# Patient Record
Sex: Male | Born: 1965 | Race: White | Hispanic: No | Marital: Married | Smoking: Never smoker
Health system: Southern US, Community
[De-identification: ages and names within clinical notes are randomized; demographics above are authoritative.]

---

## 1999-07-07 ENCOUNTER — Ambulatory Visit (HOSPITAL_BASED_OUTPATIENT_CLINIC_OR_DEPARTMENT_OTHER): Admission: RE | Admit: 1999-07-07 | Discharge: 1999-07-07 | Payer: Self-pay | Admitting: General Surgery

## 2008-02-20 ENCOUNTER — Ambulatory Visit: Payer: Self-pay | Admitting: Internal Medicine

## 2009-12-26 IMAGING — CR DG LUMBAR SPINE AP/LAT/OBLIQUES W/ FLEX AND EXT
1 series · 5 of 5 positions shown · non-contrast
Comparison: none

REASON FOR EXAM: Pain, radiculitis, lower back spasms
COMMENTS:

[Series 1: view not recorded · 0.17mm/px · 5 of 5 slices shown]
[im 1/5]
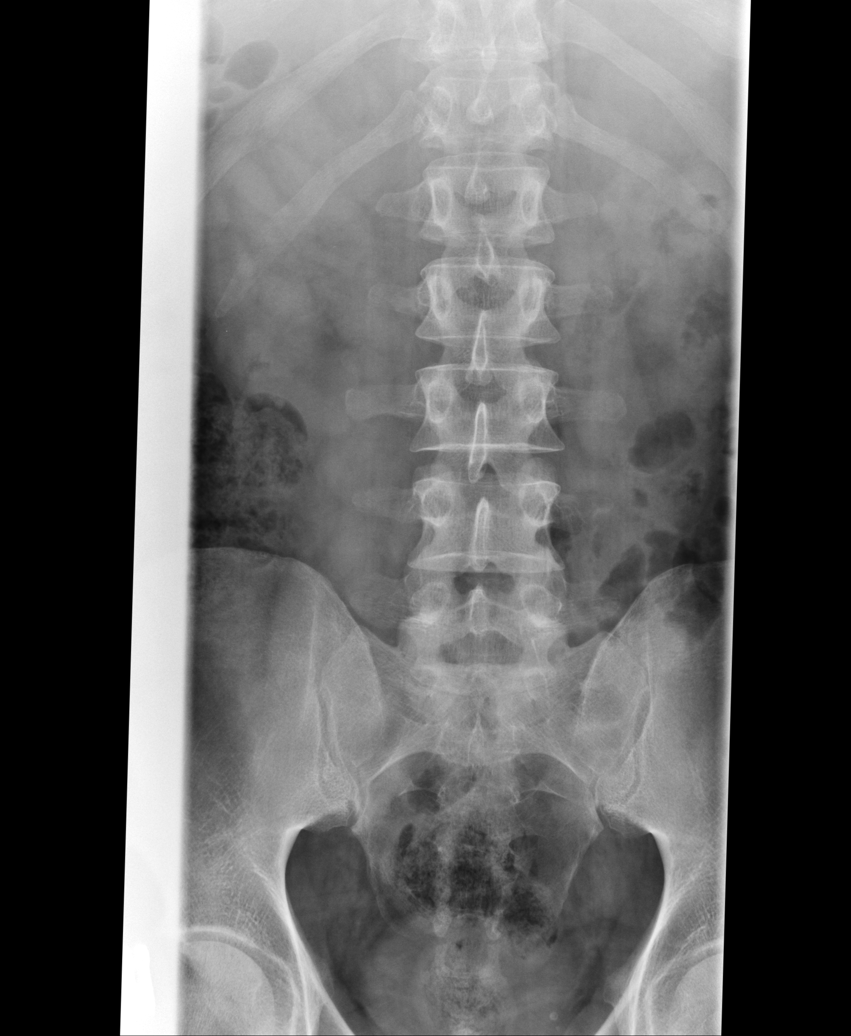
[im 2/5]
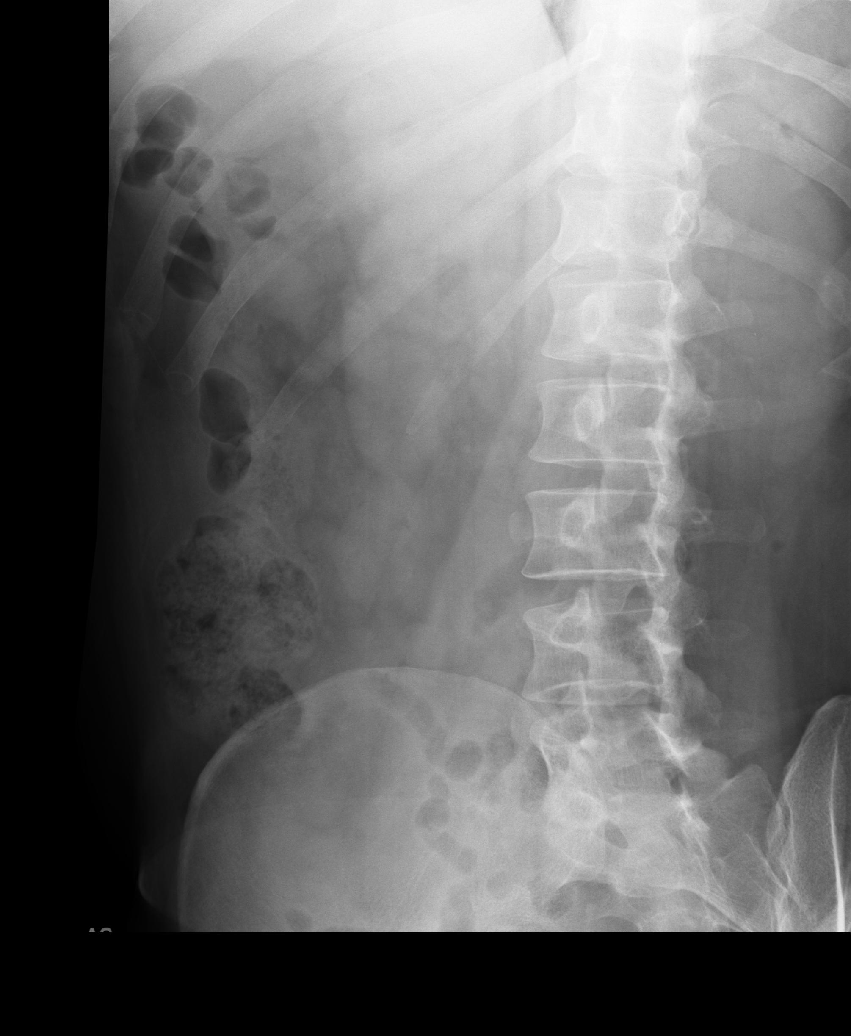
[im 3/5]
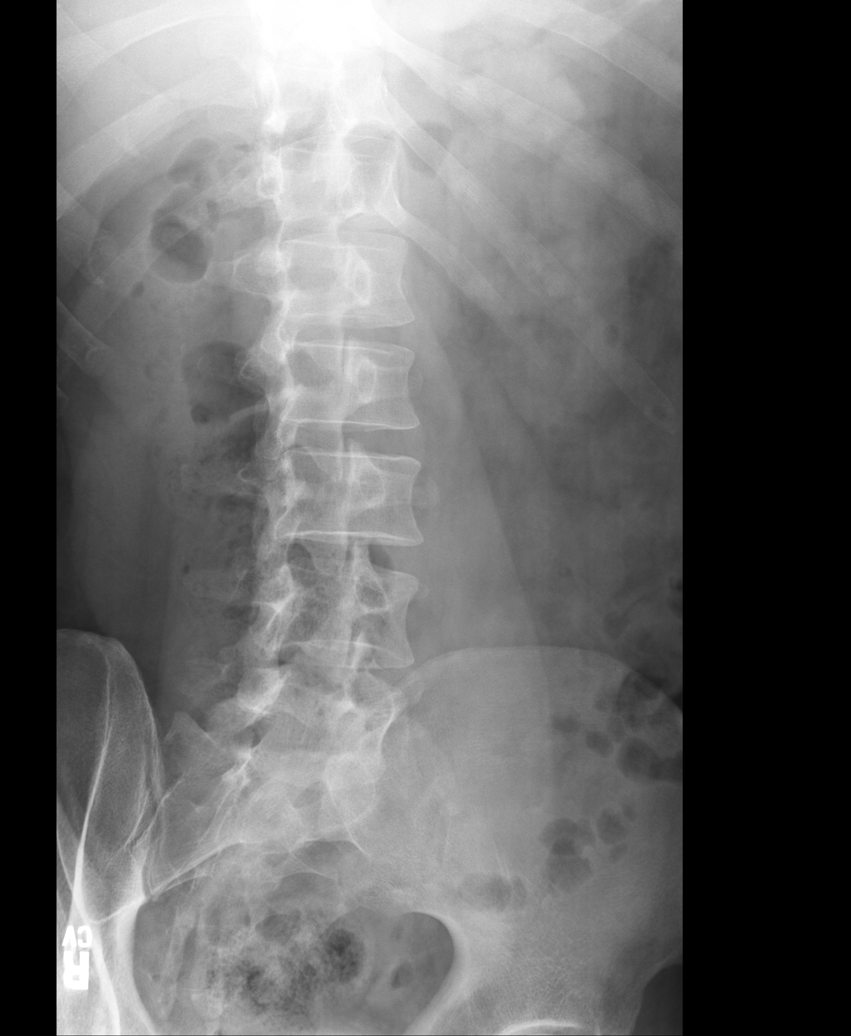
[im 4/5]
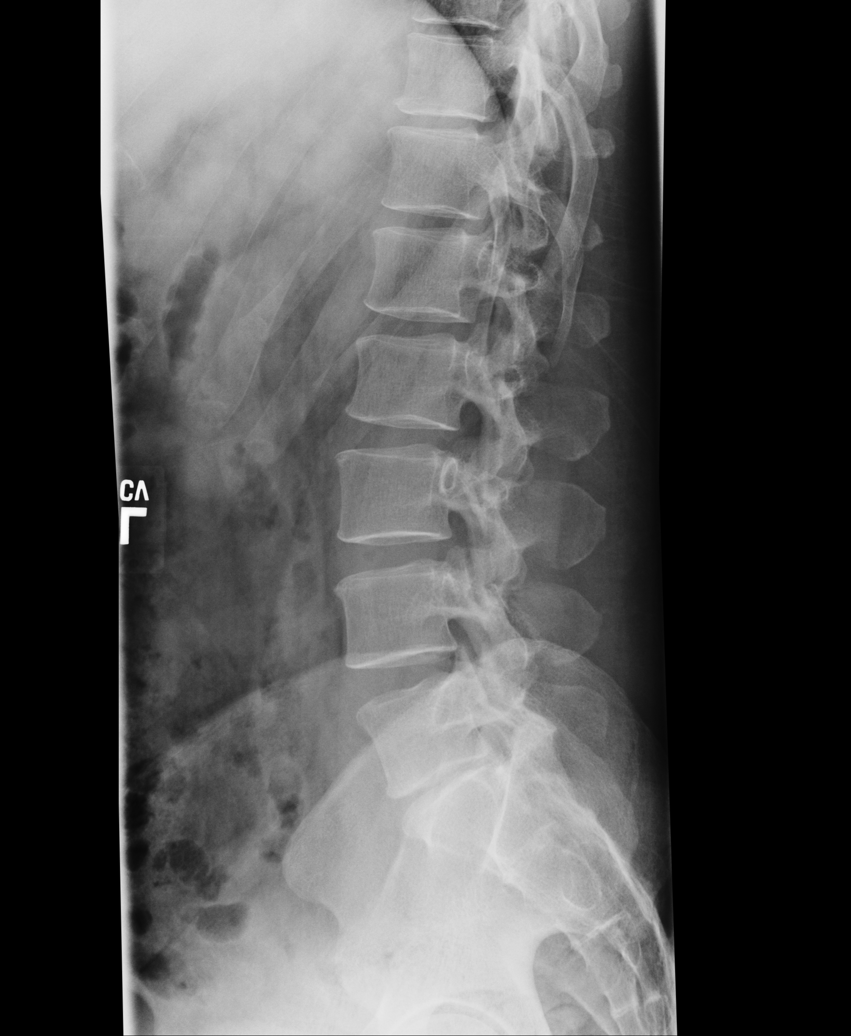
[im 5/5]
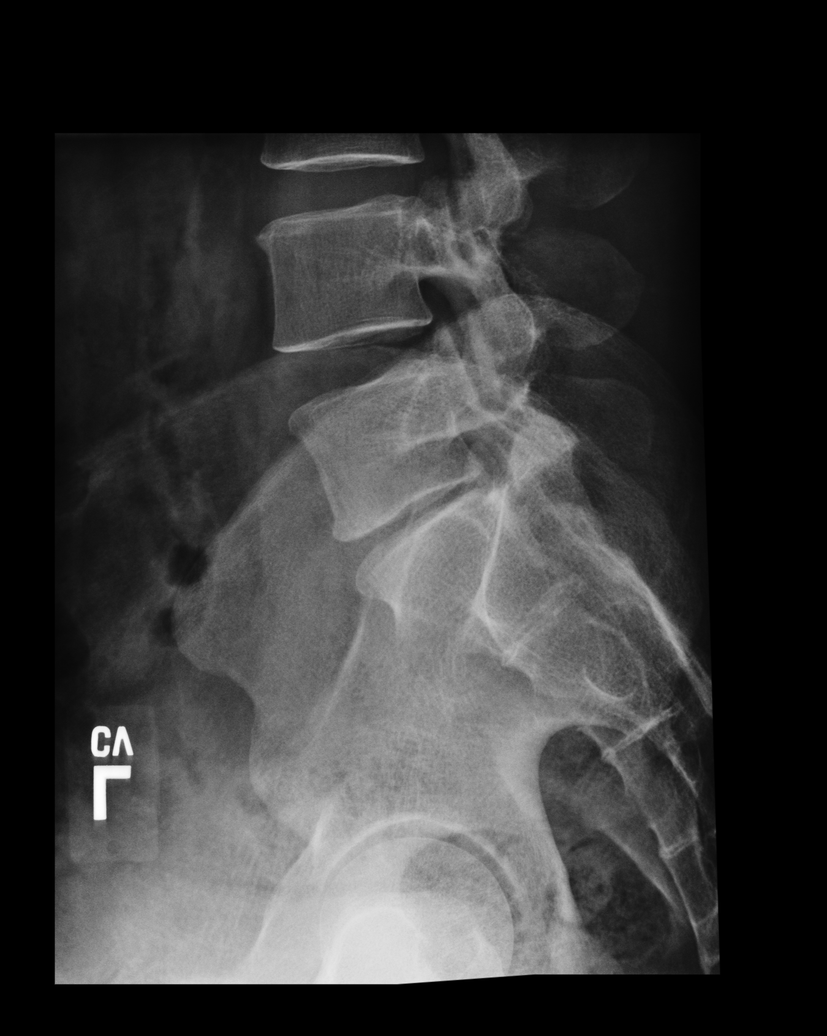

[5 of 5 positions shown; findings below may reference images not displayed]

PROCEDURE:     DXR - DXR LUMBAR SPINE WITH OBLIQUES  - February 20, 2008 [DATE]

RESULT:     The vertebral body heights are well maintained. No fracture is
seen. The vertebral body alignment is normal. There is a slightly narrowed
appearance to the L5-S1 intervertebral disc space which is thought to likely
be developmental. If the patient has symptomatology referable to this area,
the possibility of disc disease can be further evaluated by MRI. The
intervertebral disc spaces otherwise are well maintained. The pedicles are
bilaterally intact. Oblique views show no significant abnormalities of the
articular facets. The pedicles and transverse processes are normal in
appearance.
IMPRESSION: 1.  No fracture is seen.
2.  No lytic or blastic lesions are noted.
3.  There is narrowing of the L5-S1 intervertebral disc space. This could be
developmental but if disc disease is suspected, then further evaluation by
MRI would be recommended.

## 2021-05-23 DIAGNOSIS — I10 Essential (primary) hypertension: Secondary | ICD-10-CM | POA: Insufficient documentation

## 2021-08-17 ENCOUNTER — Other Ambulatory Visit: Payer: Self-pay

## 2021-08-17 ENCOUNTER — Encounter: Payer: Self-pay | Admitting: Podiatry

## 2021-08-17 ENCOUNTER — Ambulatory Visit (INDEPENDENT_AMBULATORY_CARE_PROVIDER_SITE_OTHER): Payer: 59

## 2021-08-17 ENCOUNTER — Ambulatory Visit: Payer: 59 | Admitting: Podiatry

## 2021-08-17 DIAGNOSIS — M722 Plantar fascial fibromatosis: Secondary | ICD-10-CM | POA: Diagnosis not present

## 2021-08-17 NOTE — Progress Notes (Signed)
Subjective:  Patient ID: Timothy Koch, male    DOB: [birth year],  MRN: [id number]  Chief Complaint  Patient presents with   Foot Pain    Heel pain     55 y.o. male presents with the above complaint.  Patient presents with primary complaint of right heel pain that has been going for quite some time.  Patient has been over 3 months has progressive gotten worse hurts with ambulation hurts with taking step.  He works about 15 hours a day.  He works at [employer]  as well as another full-time job.  He would like to discuss treatment options.  The pain is constant sharp pain scale is 8 out of 10 sharp shooting in nature.  He has not seen anyone as prior to seeing me.  He is tried making some changes to his shoes as well as insoles none of which has helped.   Review of Systems: Negative except as noted in the HPI. Denies N/V/F/Ch.  No past medical history on file.  Current Outpatient Medications:    ALPRAZolam (XANAX) 0.5 MG tablet, Take 1 tablet by mouth 2 (two) times daily as needed., Disp: , Rfl:    buPROPion (WELLBUTRIN XL) 150 MG 24 hr tablet, Take by mouth., Disp: , Rfl:    lisinopril (ZESTRIL) 10 MG tablet, Take 1 tablet by mouth daily., Disp: , Rfl:    venlafaxine XR (EFFEXOR-XR) 37.5 MG 24 hr capsule, Take by mouth., Disp: , Rfl:    ALPRAZolam (XANAX) 0.5 MG tablet, Take 0.5 mg by mouth 2 (two) times daily as needed., Disp: , Rfl:    buPROPion (WELLBUTRIN XL) 150 MG 24 hr tablet, Take 150 mg by mouth daily as needed., Disp: , Rfl:    levocetirizine (XYZAL) 5 MG tablet, Take by mouth., Disp: , Rfl:    lisinopril (ZESTRIL) 10 MG tablet, Take 10 mg by mouth daily., Disp: , Rfl:    naproxen sodium (ALEVE) 220 MG tablet, Take by mouth., Disp: , Rfl:    venlafaxine XR (EFFEXOR-XR) 37.5 MG 24 hr capsule, Take 37.5 mg by mouth 2 (two) times daily., Disp: , Rfl:   Social History   Tobacco Use  Smoking Status Not on file  Smokeless Tobacco Not on file    Not on File Objective:   There were no vitals filed for this visit. There is no height or weight on file to calculate BMI. Constitutional Well developed. Well nourished.  Vascular Dorsalis pedis pulses palpable bilaterally. Posterior tibial pulses palpable bilaterally. Capillary refill normal to all digits.  No cyanosis or clubbing noted. Pedal hair growth normal.  Neurologic Normal speech. Oriented to person, place, and time. Epicritic sensation to light touch grossly present bilaterally.  Dermatologic Nails well groomed and normal in appearance. No open wounds. No skin lesions.  Orthopedic: Normal joint ROM without pain or crepitus bilaterally. No visible deformities. Tender to palpation at the calcaneal tuber right. No pain with calcaneal squeeze right. Ankle ROM diminished range of motion right. Silfverskiold Test: positive right.   Radiographs: Taken and reviewed. No acute fractures or dislocations. No evidence of stress fracture.  Plantar heel spur present. Posterior heel spur present.   Assessment:   1. Plantar fasciitis, right    Plan:  Patient was evaluated and treated and all questions answered.  Plantar Fasciitis, right - XR reviewed as above.  - Educated on icing and stretching. Instructions given.  - Injection delivered to the plantar fascia as below. - DME: Plantar fascial brace dispensed  to support the medial longitudinal arch of the foot and offload pressure from the heel and prevent arch collapse during weightbearing - Pharmacologic management: None  Procedure: Injection Tendon/Ligament Location: Right plantar fascia at the glabrous junction; medial approach. Skin Prep: alcohol Injectate: 0.5 cc 0.5% marcaine plain, 0.5 cc of 1% Lidocaine, 0.5 cc kenalog 10. Disposition: Patient tolerated procedure well. Injection site dressed with a band-aid.  No follow-ups on file.

## 2021-09-14 ENCOUNTER — Ambulatory Visit: Payer: 59 | Admitting: Podiatry

## 2021-09-14 ENCOUNTER — Encounter: Payer: Self-pay | Admitting: Podiatry

## 2021-09-14 ENCOUNTER — Other Ambulatory Visit: Payer: Self-pay

## 2021-09-14 DIAGNOSIS — Q666 Other congenital valgus deformities of feet: Secondary | ICD-10-CM | POA: Diagnosis not present

## 2021-09-14 DIAGNOSIS — M722 Plantar fascial fibromatosis: Secondary | ICD-10-CM | POA: Diagnosis not present

## 2021-09-14 NOTE — Progress Notes (Signed)
Subjective:  Patient ID: Timothy Koch, male    DOB: 01-12-1966,  MRN: [id number]  Chief Complaint  Patient presents with   Plantar Fasciitis    "It's hurting."    56 y.o. male presents with the above complaint.  Patient presents with a complaint of right Planter fasciitis.  Patient states the injection did not help much and lasted for maybe 2 days.  The bracing does help a little bit but not that much.  He states it still hurts is about the same as when he first came to see me.  He would like to discuss next treatment plans.  He is still working pretty good amount on his feet.  Review of Systems: Negative except as noted in the HPI. Denies N/V/F/Ch.  No past medical history on file.  Current Outpatient Medications:    ALPRAZolam (XANAX) 0.5 MG tablet, Take 0.5 mg by mouth 2 (two) times daily as needed., Disp: , Rfl:    ALPRAZolam (XANAX) 0.5 MG tablet, Take 1 tablet by mouth 2 (two) times daily as needed., Disp: , Rfl:    buPROPion (WELLBUTRIN XL) 150 MG 24 hr tablet, Take 150 mg by mouth daily as needed., Disp: , Rfl:    buPROPion (WELLBUTRIN XL) 150 MG 24 hr tablet, Take by mouth., Disp: , Rfl:    levocetirizine (XYZAL) 5 MG tablet, Take by mouth., Disp: , Rfl:    lisinopril (ZESTRIL) 10 MG tablet, Take 10 mg by mouth daily., Disp: , Rfl:    lisinopril (ZESTRIL) 10 MG tablet, Take 1 tablet by mouth daily., Disp: , Rfl:    naproxen sodium (ALEVE) 220 MG tablet, Take by mouth., Disp: , Rfl:    venlafaxine XR (EFFEXOR-XR) 37.5 MG 24 hr capsule, Take 37.5 mg by mouth 2 (two) times daily., Disp: , Rfl:    venlafaxine XR (EFFEXOR-XR) 37.5 MG 24 hr capsule, Take by mouth., Disp: , Rfl:   Social History   Tobacco Use  Smoking Status Never  Smokeless Tobacco Never    No Known Allergies Objective:  There were no vitals filed for this visit. There is no height or weight on file to calculate BMI. Constitutional Well developed. Well nourished.  Vascular Dorsalis pedis pulses  palpable bilaterally. Posterior tibial pulses palpable bilaterally. Capillary refill normal to all digits.  No cyanosis or clubbing noted. Pedal hair growth normal.  Neurologic Normal speech. Oriented to person, place, and time. Epicritic sensation to light touch grossly present bilaterally.  Dermatologic Nails well groomed and normal in appearance. No open wounds. No skin lesions.  Orthopedic: Normal joint ROM without pain or crepitus bilaterally. No visible deformities. Tender to palpation at the calcaneal tuber right. No pain with calcaneal squeeze right. Ankle ROM diminished range of motion right. Silfverskiold Test: positive right.   Radiographs: Taken and reviewed. No acute fractures or dislocations. No evidence of stress fracture.  Plantar heel spur present. Posterior heel spur present.   Assessment:   1. Pes planovalgus   2. Plantar fasciitis, right     Plan:  Patient was evaluated and treated and all questions answered.  Plantar Fasciitis, right - XR reviewed as above.  - Educated on icing and stretching. Instructions given.  - I I will hold off on any further injection as it has not helped - DME: Cam boot immobilization - Pharmacologic management: None  Pes planovalgus -I explained to patient the etiology of pes planovalgus and relationship with Planter fasciitis and various treatment options were discussed.  Given patient foot  structure in the setting of Planter fasciitis I believe patient will benefit from custom-made orthotics to help control the hindfoot motion support the arch of the foot and take the stress away from plantar fascial.  Patient agrees with the plan like to proceed with orthotics -Patient was casted for orthotics   No follow-ups on file.

## 2021-09-21 ENCOUNTER — Other Ambulatory Visit: Payer: Self-pay

## 2021-09-21 ENCOUNTER — Ambulatory Visit: Payer: 59

## 2021-09-21 DIAGNOSIS — M722 Plantar fascial fibromatosis: Secondary | ICD-10-CM

## 2021-09-21 DIAGNOSIS — Q666 Other congenital valgus deformities of feet: Secondary | ICD-10-CM

## 2021-09-21 NOTE — Progress Notes (Signed)
SITUATION Reason for Consult: Evaluation for Bilateral Custom Foot Orthoses Patient / Caregiver Report: Patient is ready for foot orthotics  OBJECTIVE DATA: Patient History / Diagnosis:    ICD-10-CM   1. Pes planovalgus  Q66.6     2. Plantar fasciitis, right  M72.2       Current or Previous Devices: None and no history  Foot Examination: Skin presentation:   Intact Ulcers & Callousing:   None and no history Toe / Foot Deformities:  Pes planus Weight Bearing Presentation:  planus Sensation:    Intact  ORTHOTIC RECOMMENDATION Recommended Device: 1x pair of custom functional foot orthotics  GOALS OF ORTHOSES - Reduce Pain - Prevent Foot Deformity - Prevent Progression of Further Foot Deformity - Relieve Pressure - Improve the Overall Biomechanical Function of the Foot and Lower Extremity.  ACTIONS PERFORMED Patient was casted for Foot Orthoses via crush box. Procedure was explained and patient tolerated procedure well. All questions were answered and concerns addressed.  PLAN Potential out of pocket cost was communicated to patient. Casts are to be sent to [facility] for fabrication. Patient is to be called for fitting when devices are ready.

## 2021-10-17 ENCOUNTER — Ambulatory Visit: Payer: 59 | Admitting: Podiatry

## 2021-10-17 ENCOUNTER — Other Ambulatory Visit: Payer: Self-pay

## 2021-10-17 DIAGNOSIS — Q666 Other congenital valgus deformities of feet: Secondary | ICD-10-CM | POA: Diagnosis not present

## 2021-10-17 DIAGNOSIS — M722 Plantar fascial fibromatosis: Secondary | ICD-10-CM | POA: Diagnosis not present

## 2021-10-19 ENCOUNTER — Ambulatory Visit: Payer: 59

## 2021-10-19 ENCOUNTER — Other Ambulatory Visit: Payer: Self-pay

## 2021-10-19 DIAGNOSIS — M722 Plantar fascial fibromatosis: Secondary | ICD-10-CM

## 2021-10-19 DIAGNOSIS — Q666 Other congenital valgus deformities of feet: Secondary | ICD-10-CM | POA: Diagnosis not present

## 2021-10-19 NOTE — Progress Notes (Signed)
SITUATION: Reason for Visit: Fitting and Delivery of Custom Fabricated Foot Orthoses Patient Report: Patient reports comfort and is satisfied with device.  OBJECTIVE DATA: Patient History / Diagnosis:     ICD-10-CM   1. Pes planovalgus  Q66.6     2. Plantar fasciitis, right  M72.2       Provided Device:  Custom Functional Foot Orthotics     [facility]: [phone]  GOAL OF ORTHOSIS - Improve gait - Decrease energy expenditure - Improve Balance - Provide Triplanar stability of foot complex - Facilitate motion  ACTIONS PERFORMED Patient was fit with foot orthotics trimmed to shoe last. Patient tolerated fittign procedure.   Patient was provided with verbal and written instruction and demonstration regarding donning, doffing, wear, care, proper fit, function, purpose, cleaning, and use of the orthosis and in all related precautions and risks and benefits regarding the orthosis.  Patient was also provided with verbal instruction regarding how to report any failures or malfunctions of the orthosis and necessary follow up care. Patient was also instructed to contact our office regarding any change in status that may affect the function of the orthosis.  Patient demonstrated independence with proper donning, doffing, and fit and verbalized understanding of all instructions.  PLAN: Patient is to follow up in one week or as necessary (PRN). All questions were answered and concerns addressed. Plan of care was discussed with and agreed upon by the patient.

## 2021-10-20 NOTE — Progress Notes (Signed)
Subjective:  Patient ID: Timothy Koch, male    DOB: [birth year],  MRN: [id number]  Chief Complaint  Patient presents with   Plantar Fasciitis    Pt stated that he is doing great denies any pain at this time and has no new concerns     56 y.o. male presents with the above complaint.  Presents for follow-up of right Planter fasciitis.  He states he is doing a lot better.  The cam boot immobilization definitely helped.  The orthotics also helps.  He denies any other acute complaints. Review of Systems: Negative except as noted in the HPI. Denies N/V/F/Ch.  No past medical history on file.  Current Outpatient Medications:    ALPRAZolam (XANAX) 0.5 MG tablet, Take 0.5 mg by mouth 2 (two) times daily as needed., Disp: , Rfl:    ALPRAZolam (XANAX) 0.5 MG tablet, Take 1 tablet by mouth 2 (two) times daily as needed., Disp: , Rfl:    buPROPion (WELLBUTRIN XL) 150 MG 24 hr tablet, Take 150 mg by mouth daily as needed., Disp: , Rfl:    buPROPion (WELLBUTRIN XL) 150 MG 24 hr tablet, Take by mouth., Disp: , Rfl:    levocetirizine (XYZAL) 5 MG tablet, Take by mouth., Disp: , Rfl:    lisinopril (ZESTRIL) 10 MG tablet, Take 10 mg by mouth daily., Disp: , Rfl:    lisinopril (ZESTRIL) 10 MG tablet, Take 1 tablet by mouth daily., Disp: , Rfl:    naproxen sodium (ALEVE) 220 MG tablet, Take by mouth., Disp: , Rfl:    venlafaxine XR (EFFEXOR-XR) 37.5 MG 24 hr capsule, Take 37.5 mg by mouth 2 (two) times daily., Disp: , Rfl:    venlafaxine XR (EFFEXOR-XR) 37.5 MG 24 hr capsule, Take by mouth., Disp: , Rfl:   Social History   Tobacco Use  Smoking Status Never  Smokeless Tobacco Never    No Known Allergies Objective:  There were no vitals filed for this visit. There is no height or weight on file to calculate BMI. Constitutional Well developed. Well nourished.  Vascular Dorsalis pedis pulses palpable bilaterally. Posterior tibial pulses palpable bilaterally. Capillary refill normal to all  digits.  No cyanosis or clubbing noted. Pedal hair growth normal.  Neurologic Normal speech. Oriented to person, place, and time. Epicritic sensation to light touch grossly present bilaterally.  Dermatologic Nails well groomed and normal in appearance. No open wounds. No skin lesions.  Orthopedic: Normal joint ROM without pain or crepitus bilaterally. No visible deformities. No further tender to palpation at the calcaneal tuber right. No pain with calcaneal squeeze right. Ankle ROM diminished range of motion right. Silfverskiold Test: positive right.   Radiographs: Taken and reviewed. No acute fractures or dislocations. No evidence of stress fracture.  Plantar heel spur present. Posterior heel spur present.   Assessment:   1. Pes planovalgus   2. Plantar fasciitis, right      Plan:  Patient was evaluated and treated and all questions answered.  Plantar Fasciitis, right -Clinically healed with cam boot immobilization.  At this time I discussed shoe gear modification and orthotics management.  If any foot and ankle issues arise in the future he will come back and see me.  Pes planovalgus -I explained to patient the etiology of pes planovalgus and relationship with Planter fasciitis and various treatment options were discussed.  Given patient foot structure in the setting of Planter fasciitis I believe patient will benefit from custom-made orthotics to help control the hindfoot motion support the  arch of the foot and take the stress away from plantar fascial.  Patient agrees with the plan like to proceed with orthotics -Orthotics are functioning well   No follow-ups on file.

## 2022-01-09 ENCOUNTER — Encounter: Payer: Self-pay | Admitting: Podiatry
